# Patient Record
Sex: Male | Born: 1985 | Race: White | Hispanic: No | Marital: Single | Smoking: Current every day smoker
Health system: Southern US, Community
[De-identification: ages and names within clinical notes are randomized; demographics above are authoritative.]

## PROBLEM LIST (undated history)

## (undated) DIAGNOSIS — F1910 Other psychoactive substance abuse, uncomplicated: Secondary | ICD-10-CM

## (undated) HISTORY — DX: Other psychoactive substance abuse, uncomplicated: F19.10

---

## 2012-01-09 ENCOUNTER — Telehealth: Payer: Self-pay

## 2012-01-09 NOTE — Telephone Encounter (Signed)
Patient's mom called to refill doxycycline. She says pharmacy faxed request a few days ago and they have not heard from [place]. She also says she called Saturday about it but I don't see a message. Patient has not been in since 2011 but she says Dr Cleta Alberts fills the rx when he has an outbreak since it is a pre-existing condition. [id number]  [facility] on [facility]  She would like a call wether we authorize the refill or not. [phone]

## 2012-01-10 NOTE — Telephone Encounter (Signed)
We cannot prescribe for a patient who has net been evaluated in the past 12 months. Please advise patient to RTC.

## 2012-01-10 NOTE — Telephone Encounter (Signed)
Number given not correct, if he calls back he can be advised.

## 2012-01-20 ENCOUNTER — Other Ambulatory Visit: Payer: Self-pay | Admitting: *Deleted

## 2012-05-01 ENCOUNTER — Ambulatory Visit (INDEPENDENT_AMBULATORY_CARE_PROVIDER_SITE_OTHER): Payer: BC Managed Care – PPO | Admitting: Physician Assistant

## 2012-05-01 VITALS — BP 120/75 | HR 72 | Temp 98.0°F | Resp 16 | Ht 73.5 in | Wt 192.0 lb

## 2012-05-01 DIAGNOSIS — B86 Scabies: Secondary | ICD-10-CM

## 2012-05-01 MED ORDER — HYDROXYZINE HCL 25 MG PO TABS: 12.5000 mg | ORAL_TABLET | Freq: Three times a day (TID) | ORAL | Status: DC | PRN

## 2012-05-01 MED ORDER — PERMETHRIN 5 % EX CREA: TOPICAL_CREAM | Freq: Once | CUTANEOUS | Status: DC

## 2012-05-01 NOTE — Patient Instructions (Addendum)
Use the permethrin cream (about 1/2 tube) once.  Leave on for 8-12 hours.  Then wash off in the shower.  Make sure you wash bedding, towels, clothes, etc in hot water.  Take Zyrtec once daily to help with itching during the day.  You may use hydroxyzine at night to help with itching if needed.  Repeat the permethrin in 1-2 weeks if needed.  Please let us know if worsening or not improving.   Scabies Scabies are small bugs (mites) that burrow under the skin and cause red bumps and severe itching. These bugs can only be seen with a microscope. Scabies are highly contagious. They can spread easily from person to person by direct contact. They are also spread through sharing clothing or linens that have the scabies mites living in them. It is not unusual for an entire family to become infected through shared towels, clothing, or bedding.  HOME CARE INSTRUCTIONS   Your caregiver may prescribe a cream or lotion to kill the mites. If cream is prescribed, massage the cream into the entire body from the neck to the bottom of both feet. Also massage the cream into the scalp and face if your child is less than 29 year old. Avoid the eyes and mouth. Do not wash your hands after application.  Leave the cream on for 8 to 12 hours. Your child should bathe or shower after the 8 to 12 hour application period. Sometimes it is helpful to apply the cream to your child right before bedtime.  One treatment is usually effective and will eliminate approximately 95% of infestations. For severe cases, your caregiver may decide to repeat the treatment in 1 week. Everyone in your household should be treated with one application of the cream.  New rashes or burrows should not appear within 24 to 48 hours after successful treatment. However, the itching and rash may last for 2 to 4 weeks after successful treatment. Your caregiver may prescribe a medicine to help with the itching or to help the rash go away more quickly.  Scabies  can live on clothing or linens for up to 3 days. All of your child's recently used clothing, towels, stuffed toys, and bed linens should be washed in hot water and then dried in a dryer for at least 20 minutes on high heat. Items that cannot be washed should be enclosed in a plastic bag for at least 3 days.  To help relieve itching, bathe your child in a cool bath or apply cool washcloths to the affected areas.  Your child may return to school after treatment with the prescribed cream. SEEK MEDICAL CARE IF:   The itching persists longer than 4 weeks after treatment.  The rash spreads or becomes infected. Signs of infection include red blisters or yellow-tan crust. Document Released: 02/26/2005 Document Revised: 05/21/2011 Document Reviewed: 07/07/2008 [facility] Patient Information 2013 [place], [place].

## 2012-05-01 NOTE — Progress Notes (Signed)
  Subjective:    Patient ID: Gregory Calhoun, male    DOB: 20-Sep-1985, 27 y.o.   MRN: [id number]  HPI  Mr. Purnell is a 27 yr old male here with concern for scabies.  States "I got bumps."  Has had these for about a week.  Very itchy, especially at night.  Predominantly on legs, buttocks, hands, and arms.  No new exposures - did try a new soap but only after the itching began, did not make symptoms better or worse.  Girlfriend is itching as well.  No known scabies contacts.  Has tried Benadryl at night time to help him sleep.  Some sore throat this week, but does not think this is related to the rash.     Review of Systems  Skin: Positive for rash.  All other systems reviewed and are negative.       Objective:   Physical Exam  Vitals reviewed. Constitutional: He is oriented to person, place, and time. He appears well-developed and well-nourished. No distress.  HENT:  Head: Normocephalic and atraumatic.  Eyes: Conjunctivae are normal. No scleral icterus.  Cardiovascular: Normal rate and normal heart sounds.   Pulmonary/Chest: Effort normal and breath sounds normal.  Neurological: He is alert and oriented to person, place, and time.  Skin: Skin is warm and dry. Rash noted.  Scattered papular rash, mainly concentrated around feet/ankles, wrists/hands - including finger webs; some involvement of trunk; no vesicles or pustules; no drainage; back with scarring and numerous acneiform pustules and comedones  Psychiatric: He has a normal mood and affect. His behavior is normal.     Filed Vitals:   05/01/12 1920  BP: 120/75  Pulse: 72  Temp: 98 F (36.7 C)  Resp: 16        Assessment & Plan:  Scabies - Plan: hydrOXYzine (ATARAX/VISTARIL) 25 MG tablet, permethrin (ELIMITE) 5 % cream   Mr. Och is a 27 yr old male here with rash and pruritus.  Rash appears consistent with scabies.  No systemic symptoms.  Will treat with permethrin x 1.  Repeat in 1-2 weeks if necessary.  Zyrtec for  itching during the day.  Atarax if needed for itching at night.  If worsening or not improving, pt will RTC.

## 2013-04-17 ENCOUNTER — Ambulatory Visit (INDEPENDENT_AMBULATORY_CARE_PROVIDER_SITE_OTHER): Payer: BC Managed Care – PPO | Admitting: Internal Medicine

## 2013-04-17 VITALS — BP 132/80 | HR 78 | Temp 98.2°F | Resp 20 | Ht 73.5 in | Wt 194.0 lb

## 2013-04-17 DIAGNOSIS — IMO0002 Reserved for concepts with insufficient information to code with codable children: Secondary | ICD-10-CM

## 2013-04-17 DIAGNOSIS — L0211 Cutaneous abscess of neck: Secondary | ICD-10-CM

## 2013-04-17 DIAGNOSIS — M542 Cervicalgia: Secondary | ICD-10-CM

## 2013-04-17 DIAGNOSIS — L03221 Cellulitis of neck: Secondary | ICD-10-CM

## 2013-04-17 MED ORDER — DOXYCYCLINE HYCLATE 100 MG PO TABS: 100.0000 mg | ORAL_TABLET | Freq: Two times a day (BID) | ORAL | Status: DC

## 2013-04-17 MED ORDER — MUPIROCIN 2 % EX OINT: 1.0000 "application " | TOPICAL_OINTMENT | Freq: Three times a day (TID) | CUTANEOUS | Status: DC

## 2013-04-17 NOTE — Progress Notes (Signed)
Procedure Note: Verbal consent obtained.  Local anesthesia with 3 cc 2% lidocaine.  Betadine prep.  Incision with 11 blade.  Copious sebaceous material and straw colored fluid expressed.  No purulence.  Wound irrigated with remaining anesthetic.  Packed with 1/4 inch plain packing.  Cleansed and dressed.  Discussed wound care.  Pt tolerated very well.

## 2013-04-17 NOTE — Progress Notes (Signed)
   Subjective:    Patient ID: Gregory Calhoun, male    DOB: 06/29/1985, 28 y.o.   MRN: [id number]  HPI Patient comes into our office with a cyst on his neck in the front It was small bump 1 month ago  Then he woke up about 4 days ago and it had gotten bigger Bump is red swollen with a white head on it and tender to the touch Hurts to turn neck back and fourth haven't taken anything for it haven't but heat on it HX of cyst behind ears and other places on body within 8 yrs   Review of Systems  Constitutional: Negative for fever and chills.  Skin: Positive for color change.       Red in the area and tender       Objective:   Physical Exam  Constitutional: He is oriented to person, place, and time. He appears well-developed and well-nourished.  HENT:  Head: Normocephalic.  Eyes: EOM are normal.  Neck: Normal range of motion. Neck supple.    Pulmonary/Chest: Effort normal.  Musculoskeletal: Normal range of motion.  Lymphadenopathy:    He has no cervical adenopathy.  Neurological: He is alert and oriented to person, place, and time. He exhibits normal muscle tone. Coordination normal.  Skin: Lesion and rash noted. Rash is nodular. There is erythema.   Infected sebaceous cyst ID by Frances FurbishElizabeth Egan PAc       Assessment & Plan:  Wound care daily Doxycyline 100mg  bid/mupirocin

## 2013-04-17 NOTE — Progress Notes (Signed)
   Subjective:    Patient ID: Gregory FishJoshua P Calhoun, male    DOB: 12/03/1985, 28 y.o.   MRN: [id number]  HPI    Review of Systems     Objective:   Physical Exam        Assessment & Plan:

## 2013-04-17 NOTE — Patient Instructions (Signed)
Cellulitis Cellulitis is an infection of the skin and the tissue beneath it. The infected area is usually red and tender. Cellulitis occurs most often in the arms and lower legs.  CAUSES  Cellulitis is caused by bacteria that enter the skin through cracks or cuts in the skin. The most common types of bacteria that cause cellulitis are Staphylococcus and Streptococcus. SYMPTOMS   Redness and warmth.  Swelling.  Tenderness or pain.  Fever. DIAGNOSIS  Your caregiver can usually determine what is wrong based on a physical exam. Blood tests may also be done. TREATMENT  Treatment usually involves taking an antibiotic medicine. HOME CARE INSTRUCTIONS   Take your antibiotics as directed. Finish them even if you start to feel better.  Keep the infected arm or leg elevated to reduce swelling.  Apply a warm cloth to the affected area up to 4 times per day to relieve pain.  Only take over-the-counter or prescription medicines for pain, discomfort, or fever as directed by your caregiver.  Keep all follow-up appointments as directed by your caregiver. SEEK MEDICAL CARE IF:   You notice red streaks coming from the infected area.  Your red area gets larger or turns dark in color.  Your bone or joint underneath the infected area becomes painful after the skin has healed.  Your infection returns in the same area or another area.  You notice a swollen bump in the infected area.  You develop new symptoms. SEEK IMMEDIATE MEDICAL CARE IF:   You have a fever.  You feel very sleepy.  You develop vomiting or diarrhea.  You have a general ill feeling (malaise) with muscle aches and pains. MAKE SURE YOU:   Understand these instructions.  Will watch your condition.  Will get help right away if you are not doing well or get worse. Document Released: 12/06/2004 Document Revised: 08/28/2011 Document Reviewed: 05/14/2011 [employer] Patient Information 2014 [employer].  

## 2013-04-18 ENCOUNTER — Encounter (HOSPITAL_COMMUNITY): Payer: Self-pay | Admitting: Emergency Medicine

## 2013-04-18 ENCOUNTER — Emergency Department (HOSPITAL_COMMUNITY)
Admission: EM | Admit: 2013-04-18 | Discharge: 2013-04-18 | Disposition: A | Discharge status: Home / Self Care | Payer: BC Managed Care – PPO | Attending: Emergency Medicine | Admitting: Emergency Medicine

## 2013-04-18 ENCOUNTER — Other Ambulatory Visit: Payer: Self-pay

## 2013-04-18 ENCOUNTER — Emergency Department (HOSPITAL_COMMUNITY): Payer: BC Managed Care – PPO

## 2013-04-18 DIAGNOSIS — F17200 Nicotine dependence, unspecified, uncomplicated: Secondary | ICD-10-CM | POA: Insufficient documentation

## 2013-04-18 DIAGNOSIS — R072 Precordial pain: Secondary | ICD-10-CM | POA: Insufficient documentation

## 2013-04-18 DIAGNOSIS — R079 Chest pain, unspecified: Secondary | ICD-10-CM

## 2013-04-18 DIAGNOSIS — R059 Cough, unspecified: Secondary | ICD-10-CM | POA: Insufficient documentation

## 2013-04-18 DIAGNOSIS — R05 Cough: Secondary | ICD-10-CM | POA: Insufficient documentation

## 2013-04-18 DIAGNOSIS — Z792 Long term (current) use of antibiotics: Secondary | ICD-10-CM | POA: Insufficient documentation

## 2013-04-18 DIAGNOSIS — J3489 Other specified disorders of nose and nasal sinuses: Secondary | ICD-10-CM | POA: Insufficient documentation

## 2013-04-18 LAB — CBC WITH DIFFERENTIAL/PLATELET
Basophils Absolute: 0 10*3/uL (ref 0.0–0.1)
Basophils Relative: 0 % (ref 0–1)
EOS ABS: 0.5 10*3/uL (ref 0.0–0.7)
Eosinophils Relative: 6 % — ABNORMAL HIGH (ref 0–5)
HCT: 40.8 % (ref 39.0–52.0)
HEMOGLOBIN: 14.3 g/dL (ref 13.0–17.0)
LYMPHS ABS: 2.3 10*3/uL (ref 0.7–4.0)
LYMPHS PCT: 26 % (ref 12–46)
MCH: 30.4 pg (ref 26.0–34.0)
MCHC: 35 g/dL (ref 30.0–36.0)
MCV: 86.8 fL (ref 78.0–100.0)
MONOS PCT: 9 % (ref 3–12)
Monocytes Absolute: 0.8 10*3/uL (ref 0.1–1.0)
NEUTROS PCT: 59 % (ref 43–77)
Neutro Abs: 5.1 10*3/uL (ref 1.7–7.7)
PLATELETS: 192 10*3/uL (ref 150–400)
RBC: 4.7 MIL/uL (ref 4.22–5.81)
RDW: 12.8 % (ref 11.5–15.5)
WBC: 8.7 10*3/uL (ref 4.0–10.5)

## 2013-04-18 LAB — POCT I-STAT, CHEM 8
BUN: 11 mg/dL (ref 6–23)
CREATININE: 0.8 mg/dL (ref 0.50–1.35)
Calcium, Ion: 1.32 mmol/L — ABNORMAL HIGH (ref 1.12–1.23)
Chloride: 100 mEq/L (ref 96–112)
Glucose, Bld: 111 mg/dL — ABNORMAL HIGH (ref 70–99)
HCT: 45 % (ref 39.0–52.0)
HEMOGLOBIN: 15.3 g/dL (ref 13.0–17.0)
POTASSIUM: 4 meq/L (ref 3.7–5.3)
Sodium: 140 mEq/L (ref 137–147)
TCO2: 26 mmol/L (ref 0–100)

## 2013-04-18 LAB — POCT I-STAT TROPONIN I: Troponin i, poc: 0 ng/mL (ref 0.00–0.08)

## 2013-04-18 MED ORDER — IBUPROFEN 800 MG PO TABS
800.0000 mg | ORAL_TABLET | Freq: Once | ORAL | Status: AC
  Administered 2013-04-18: 800 mg via ORAL
  Filled 2013-04-18: qty 1

## 2013-04-18 NOTE — ED Provider Notes (Signed)
CSN: [id number]     Arrival date & time 04/18/13  4098 History   First MD Initiated Contact with Patient 04/18/13 [phone]     Chief Complaint  Patient presents with  . Chest Pain   (Consider location/radiation/quality/duration/timing/severity/associated sxs/prior Treatment) Patient is a 28 y.o. male presenting with chest pain. The history is provided by the patient. No language interpreter was used.  Chest Pain Pain location:  Substernal area and epigastric Pain quality: sharp   Pain radiates to:  Does not radiate Pain radiates to the back: no   Context: breathing and at rest   Associated symptoms: cough   Associated symptoms: no abdominal pain, no anxiety, no back pain, no shortness of breath, not vomiting and no weakness   Cough:    Cough characteristics:  Productive   Sputum characteristics:  Nondescript   Duration:  1 day Risk factors: smoking   Risk factors: no prior DVT/PE and no surgery     Pt is a 28 year old male who presents with chest pain this morning. He reports that his chest started hurting this morning and he has also developed a productive cough in the last 1-2 days. He denies fever, chills, vomiting or diarrhea. He reports that he has been a smoker but has been chewing gum, trying to stop smoking. He denies anxiety, shortness of breath or drug use. He denies any history of cardiac problems, DVT or PE. No recent surgery except for the drainage of a superficial abscess on his anterior neck yesterday at urgent care. He denies any swelling in his neck or difficulty swallowing or breathing. He reports that he has also had some nasal congestion for a few days.   Past Medical History  Diagnosis Date  . Substance abuse    History reviewed. No pertinent past surgical history. No family history on file. History  Substance Use Topics  . Smoking status: Current Every Day Smoker -- 0.50 packs/day  . Smokeless tobacco: Not on file  . Alcohol Use: Not on file    Review of  Systems  HENT: Positive for congestion.   Respiratory: Positive for cough. Negative for shortness of breath.   Cardiovascular: Positive for chest pain.  Gastrointestinal: Negative for vomiting and abdominal pain.  Musculoskeletal: Negative for back pain.  Neurological: Negative for weakness.  All other systems reviewed and are negative.    Allergies  Review of patient's allergies indicates no known allergies.  Home Medications   Current Outpatient Rx  Name  Route  Sig  Dispense  Refill  . doxycycline (VIBRA-TABS) 100 MG tablet   Oral   Take 1 tablet (100 mg total) by mouth 2 (two) times daily.   20 tablet   0   . ibuprofen (ADVIL,MOTRIN) 200 MG tablet   Oral   Take 200-800 mg by mouth every 6 (six) hours as needed (pain).         . mupirocin ointment (BACTROBAN) 2 %   Topical   Apply 1 application topically 3 (three) times daily.   22 g   0    BP 127/78  Pulse 85  Temp(Src) 97.6 F (36.4 C) (Oral)  Resp 20  Ht 6\' 2"  (1.88 m)  Wt 195 lb (88.451 kg)  BMI 25.03 kg/m2  SpO2 96% Physical Exam  Nursing note and vitals reviewed. Constitutional: He is oriented to person, place, and time. He appears well-developed and well-nourished. No distress.  HENT:  Head: Normocephalic and atraumatic.  Right Ear: External ear normal.  Left ear with fullness and drainage behind TM. Mild post oropharyngeal edema and erythema. Tonsils- grade I.   Eyes: Conjunctivae and EOM are normal.  Neck: Normal range of motion. Neck supple. No JVD present. No tracheal deviation present. No thyromegaly present.  Cardiovascular: Normal rate, regular rhythm, normal heart sounds and intact distal pulses.   Pulmonary/Chest: Effort normal and breath sounds normal. No respiratory distress. He has no wheezes.  Abdominal: Soft. Bowel sounds are normal. He exhibits no distension. There is no tenderness. There is no rebound and no guarding.  Musculoskeletal: Normal range of motion.  Lymphadenopathy:     He has no cervical adenopathy.  Neurological: He is oriented to person, place, and time. No cranial nerve deficit. Coordination normal.  Skin: Skin is warm and dry.  Psychiatric: He has a normal mood and affect. His behavior is normal. Judgment and thought content normal.    ED Course  Procedures (including critical care time) Labs Review Labs Reviewed - No data to display Imaging Review No results found.  EKG Interpretation   None       MDM   1. Chest pain     Normal EKG. No leukocytosis or anemia. CMP within normal limits. Negative troponin. Pt is a smoker, otherwise no risk factors for cardiac problems. Pt has had recent URI symptoms. Afebrile, no cough or respiratory difficulty or pain with breathing. No history of DVT or PE. Low suspicion for cardiac etiology. Pt reports feeling better. VS stable. Advised to go home and rest today. Return precautions given.      Irish EldersKelly Danah Reinecke, NP 04/26/13 2043

## 2013-04-18 NOTE — ED Notes (Signed)
Pt. Stated, i started having chest pain off and on since last night. Sometimes its sharp but it woke me up this morning.

## 2013-04-18 NOTE — ED Notes (Signed)
Vital signs stable. 

## 2013-04-18 NOTE — ED Notes (Signed)
Patient transported to X-ray 

## 2013-04-18 NOTE — Discharge Instructions (Signed)
Chest Pain (Nonspecific) °It is often hard to give a specific diagnosis for the cause of chest pain. There is always a chance that your pain could be related to something serious, such as a heart attack or a blood clot in the lungs. You need to follow up with your caregiver for further evaluation. °CAUSES  °· Heartburn. °· Pneumonia or bronchitis. °· Anxiety or stress. °· Inflammation around your heart (pericarditis) or lung (pleuritis or pleurisy). °· A blood clot in the lung. °· A collapsed lung (pneumothorax). It can develop suddenly on its own (spontaneous pneumothorax) or from injury (trauma) to the chest. °· Shingles infection (herpes zoster virus). °The chest wall is composed of bones, muscles, and cartilage. Any of these can be the source of the pain. °· The bones can be bruised by injury. °· The muscles or cartilage can be strained by coughing or overwork. °· The cartilage can be affected by inflammation and become sore (costochondritis). °DIAGNOSIS  °Lab tests or other studies, such as X-rays, electrocardiography, stress testing, or cardiac imaging, may be needed to find the cause of your pain.  °TREATMENT  °· Treatment depends on what may be causing your chest pain. Treatment may include: °· Acid blockers for heartburn. °· Anti-inflammatory medicine. °· Pain medicine for inflammatory conditions. °· Antibiotics if an infection is present. °· You may be advised to change lifestyle habits. This includes stopping smoking and avoiding alcohol, caffeine, and chocolate. °· You may be advised to keep your head raised (elevated) when sleeping. This reduces the chance of acid going backward from your stomach into your esophagus. °· Most of the time, nonspecific chest pain will improve within 2 to 3 days with rest and mild pain medicine. °HOME CARE INSTRUCTIONS  °· If antibiotics were prescribed, take your antibiotics as directed. Finish them even if you start to feel better. °· For the next few days, avoid physical  activities that bring on chest pain. Continue physical activities as directed. °· Do not smoke. °· Avoid drinking alcohol. °· Only take over-the-counter or prescription medicine for pain, discomfort, or fever as directed by your caregiver. °· Follow your caregiver's suggestions for further testing if your chest pain does not go away. °· Keep any follow-up appointments you made. If you do not go to an appointment, you could develop lasting (chronic) problems with pain. If there is any problem keeping an appointment, you must call to reschedule. °SEEK MEDICAL CARE IF:  °· You think you are having problems from the medicine you are taking. Read your medicine instructions carefully. °· Your chest pain does not go away, even after treatment. °· You develop a rash with blisters on your chest. °SEEK IMMEDIATE MEDICAL CARE IF:  °· You have increased chest pain or pain that spreads to your arm, neck, jaw, back, or abdomen. °· You develop shortness of breath, an increasing cough, or you are coughing up blood. °· You have severe back or abdominal pain, feel nauseous, or vomit. °· You develop severe weakness, fainting, or chills. °· You have a fever. °THIS IS AN EMERGENCY. Do not wait to see if the pain will go away. Get medical help at once. Call your local emergency services (911 in [place] Do not drive yourself to the hospital. °MAKE SURE YOU:  °· Understand these instructions. °· Will watch your condition. °· Will get help right away if you are not doing well or get worse. °Document Released: 12/06/2004 Document Revised: 05/21/2011 Document Reviewed: 10/02/2007 °[employer]® Patient Information ©2014 [employer]   [employer].   Take ibuprofen for discomfort Return for fever, shortness of breath or worsening pain

## 2013-04-19 ENCOUNTER — Ambulatory Visit (INDEPENDENT_AMBULATORY_CARE_PROVIDER_SITE_OTHER): Payer: BC Managed Care – PPO | Admitting: Physician Assistant

## 2013-04-19 VITALS — BP 120/66 | HR 83 | Temp 98.0°F | Resp 18 | Wt 192.0 lb

## 2013-04-19 DIAGNOSIS — L089 Local infection of the skin and subcutaneous tissue, unspecified: Secondary | ICD-10-CM

## 2013-04-19 DIAGNOSIS — L723 Sebaceous cyst: Secondary | ICD-10-CM

## 2013-04-19 NOTE — Progress Notes (Signed)
   Subjective:    Patient ID: Gregory Calhoun, male    DOB: 03/06/1986, [age] y.o.   MRN: [id number]  HPI   Mr. Gregory Calhoun is a 28 yr old male here for wound care following I&D of an infected sebaceous cyst 48 hours ago.  He reports he is doing well.  Much less discomfort.  Minimal drainage from the wound.  He is changing the dressing.  Taking the antibiotics as directed and tolerating them well.    Review of Systems  Constitutional: Negative for fever and chills.  Respiratory: Negative.   Cardiovascular: Negative.   Gastrointestinal: Negative for nausea and vomiting.  Musculoskeletal: Negative.   Skin: Positive for wound.       Objective:   Physical Exam  Vitals reviewed. Constitutional: He appears well-developed and well-nourished. No distress.  HENT:  Head: Normocephalic and atraumatic.  Eyes: Conjunctivae are normal. No scleral icterus.  Pulmonary/Chest: Effort normal.  Neurological: He is alert.  Skin: Skin is warm and dry.     Healing wound at right anterior neck; mild TTP; slight erythema and induration  Psychiatric: He has a normal mood and affect. His behavior is normal.    Wound Care: Dressing and packing removed.  Spontaneous drainage of a small amount of blood.  No further purulence or sebaceous material.  Irrigated with 5cc 2% plain lidocaine.  Not repacked.  Dressing applied.     Assessment & Plan:  Infected sebaceous cyst   Mr. Gregory Calhoun is a 28 yr old male here for wound care.  The wound appears to be healing very well.  I have not repacked today.  Pt to continue dressing changes until completely healed.  Finish abx as directed.  RTC if concerns.   Loleta DickerE. Elizabeth Ernestine Rohman MHS, PA-C [facility] 2/8/20159:20 PM

## 2013-04-27 NOTE — ED Provider Notes (Signed)
Medical screening examination/treatment/procedure(s) were performed by non-physician practitioner and as supervising physician I was immediately available for consultation/collaboration.  Lynzi Meulemans L Aryonna Gunnerson, MD 04/27/13 0751 

## 2013-12-13 ENCOUNTER — Emergency Department (HOSPITAL_COMMUNITY)
Admission: EM | Admit: 2013-12-13 | Discharge: 2013-12-14 | Disposition: A | Discharge status: Home / Self Care | Payer: BC Managed Care – PPO | Attending: Emergency Medicine | Admitting: Emergency Medicine

## 2013-12-13 ENCOUNTER — Encounter (HOSPITAL_COMMUNITY): Payer: Self-pay | Admitting: Emergency Medicine

## 2013-12-13 DIAGNOSIS — Z041 Encounter for examination and observation following transport accident: Secondary | ICD-10-CM

## 2013-12-13 DIAGNOSIS — Z720 Tobacco use: Secondary | ICD-10-CM | POA: Diagnosis not present

## 2013-12-13 DIAGNOSIS — Z043 Encounter for examination and observation following other accident: Secondary | ICD-10-CM

## 2013-12-13 DIAGNOSIS — Z792 Long term (current) use of antibiotics: Secondary | ICD-10-CM | POA: Diagnosis not present

## 2013-12-13 DIAGNOSIS — Y9389 Activity, other specified: Secondary | ICD-10-CM | POA: Diagnosis not present

## 2013-12-13 DIAGNOSIS — Y92410 Unspecified street and highway as the place of occurrence of the external cause: Secondary | ICD-10-CM | POA: Insufficient documentation

## 2013-12-13 DIAGNOSIS — Z8659 Personal history of other mental and behavioral disorders: Secondary | ICD-10-CM | POA: Diagnosis not present

## 2013-12-13 DIAGNOSIS — S0101XA Laceration without foreign body of scalp, initial encounter: Secondary | ICD-10-CM | POA: Diagnosis present

## 2013-12-13 DIAGNOSIS — S0191XA Laceration without foreign body of unspecified part of head, initial encounter: Secondary | ICD-10-CM
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
## 2013-12-14 DIAGNOSIS — S0101XA Laceration without foreign body of scalp, initial encounter: Secondary | ICD-10-CM | POA: Diagnosis not present

## 2013-12-14 MED ORDER — BACITRACIN ZINC 500 UNIT/GM EX OINT
1.0000 "application " | TOPICAL_OINTMENT | Freq: Two times a day (BID) | CUTANEOUS | Status: DC
  Administered 2013-12-14: 1 via TOPICAL
  Filled 2013-12-14: qty 15

## 2013-12-14 MED ORDER — MUPIROCIN CALCIUM 2 % EX CREA: 1.0000 "application " | TOPICAL_CREAM | Freq: Two times a day (BID) | CUTANEOUS | Status: AC

## 2013-12-14 NOTE — Discharge Instructions (Signed)
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
## 2014-12-14 ENCOUNTER — Encounter: Payer: Self-pay | Admitting: Emergency Medicine

## 2015-02-21 ENCOUNTER — Telehealth: Payer: Self-pay

## 2015-02-21 NOTE — Telephone Encounter (Signed)
I will be happy to see the patient and evaluate his scar.
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
## 2015-03-21 ENCOUNTER — Ambulatory Visit (INDEPENDENT_AMBULATORY_CARE_PROVIDER_SITE_OTHER): Payer: BLUE CROSS/BLUE SHIELD | Admitting: Internal Medicine

## 2015-03-21 ENCOUNTER — Encounter: Payer: Self-pay | Admitting: Internal Medicine

## 2015-03-21 VITALS — BP 120/68 | HR 67 | Temp 97.7°F | Resp 16 | Ht 73.0 in | Wt 199.4 lb

## 2015-03-21 DIAGNOSIS — L905 Scar conditions and fibrosis of skin: Secondary | ICD-10-CM | POA: Diagnosis not present

## 2015-03-21 DIAGNOSIS — J0100 Acute maxillary sinusitis, unspecified: Secondary | ICD-10-CM | POA: Diagnosis not present

## 2015-03-21 MED ORDER — AMOXICILLIN 875 MG PO TABS: 875.0000 mg | ORAL_TABLET | Freq: Two times a day (BID) | ORAL | Status: AC
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
## 2015-03-31 ENCOUNTER — Telehealth: Payer: Self-pay

## 2015-03-31 NOTE — Telephone Encounter (Signed)
Pt called asking about referral// shared info for [facility]  [phone]

## 2015-07-17 ENCOUNTER — Emergency Department (HOSPITAL_COMMUNITY)
Admission: EM | Admit: 2015-07-17 | Discharge: 2015-07-17 | Disposition: A | Discharge status: Home / Self Care | Payer: No Typology Code available for payment source | Attending: Emergency Medicine | Admitting: Emergency Medicine

## 2015-07-17 ENCOUNTER — Encounter (HOSPITAL_COMMUNITY): Payer: Self-pay | Admitting: *Deleted

## 2015-07-17 DIAGNOSIS — Y998 Other external cause status: Secondary | ICD-10-CM | POA: Insufficient documentation

## 2015-07-17 DIAGNOSIS — Y9351 Activity, roller skating (inline) and skateboarding: Secondary | ICD-10-CM | POA: Insufficient documentation

## 2015-07-17 DIAGNOSIS — S0990XA Unspecified injury of head, initial encounter: Secondary | ICD-10-CM | POA: Insufficient documentation

## 2015-07-17 DIAGNOSIS — F17200 Nicotine dependence, unspecified, uncomplicated: Secondary | ICD-10-CM | POA: Insufficient documentation

## 2015-07-17 DIAGNOSIS — Y9289 Other specified places as the place of occurrence of the external cause: Secondary | ICD-10-CM | POA: Insufficient documentation

## 2015-07-17 DIAGNOSIS — S299XXA Unspecified injury of thorax, initial encounter: Secondary | ICD-10-CM | POA: Insufficient documentation
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
## 2015-07-17 NOTE — ED Notes (Signed)
Called for patient a 3rd time in the lobby, no response. Will remove patient from track board

## 2015-08-05 ENCOUNTER — Encounter (HOSPITAL_COMMUNITY): Payer: Self-pay | Admitting: *Deleted

## 2015-08-05 ENCOUNTER — Emergency Department (HOSPITAL_COMMUNITY)
Admission: EM | Admit: 2015-08-05 | Discharge: 2015-08-05 | Disposition: A | Discharge status: Home / Self Care | Payer: BLUE CROSS/BLUE SHIELD | Attending: Emergency Medicine | Admitting: Emergency Medicine

## 2015-08-05 DIAGNOSIS — F17200 Nicotine dependence, unspecified, uncomplicated: Secondary | ICD-10-CM | POA: Diagnosis not present

## 2015-08-05 DIAGNOSIS — K0889 Other specified disorders of teeth and supporting structures: Secondary | ICD-10-CM | POA: Diagnosis present

## 2015-08-05 DIAGNOSIS — K029 Dental caries, unspecified: Secondary | ICD-10-CM | POA: Insufficient documentation

## 2015-08-05 DIAGNOSIS — Z792 Long term (current) use of antibiotics: Secondary | ICD-10-CM | POA: Diagnosis not present

## 2015-08-05 MED ORDER — OXYCODONE-ACETAMINOPHEN 5-325 MG PO TABS
1.0000 | ORAL_TABLET | Freq: Once | ORAL | Status: AC
  Administered 2015-08-05: 1 via ORAL
  Filled 2015-08-05: qty 1

## 2015-08-05 MED ORDER — NAPROXEN 500 MG PO TABS: 500.0000 mg | ORAL_TABLET | Freq: Two times a day (BID) | ORAL | Status: AC

## 2015-08-05 MED ORDER — IBUPROFEN 200 MG PO TABS
400.0000 mg | ORAL_TABLET | Freq: Once | ORAL | Status: AC | PRN
  Administered 2015-08-05: 400 mg via ORAL
  Filled 2015-08-05: qty 2

## 2015-08-05 NOTE — ED Notes (Signed)
PA at bedside.

## 2015-08-05 NOTE — ED Notes (Signed)
Pt  C/o lower, left side dental pain, since 3am

## 2015-08-05 NOTE — ED Provider Notes (Signed)
CSN: [id number]     Arrival date & time 08/05/15  0439 History   First MD Initiated Contact with Patient 08/05/15 [phone]     Chief Complaint  Patient presents with  . Dental Pain   (Consider location/radiation/quality/duration/timing/severity/associated sxs/prior Treatment) HPI 29 y.o. male presents to the Emergency Department today complaining of left lower dental pain since 3am. No hx of the same. States pain is 7/10 and worse with eating. No fevers. No N/V. No trouble moving jaw. No trouble speaking or clearing secretions. OTC medication without relief. No other symptoms noted.     Past Medical History  Diagnosis Date  . Substance abuse    History reviewed. No pertinent past surgical history. No family history on file. Social History  Substance Use Topics  . Smoking status: Current Every Day Smoker -- 0.50 packs/day  . Smokeless tobacco: None  . Alcohol Use: No    Review of Systems  Constitutional: Negative for fever.  HENT: Positive for dental problem. Negative for sore throat and trouble swallowing.   Gastrointestinal: Negative for nausea.   Allergies  Review of patient's allergies indicates no known allergies.  Home Medications   Prior to Admission medications   Medication Sig Start Date End Date Taking? Authorizing Provider  amoxicillin (AMOXIL) 875 MG tablet Take 1 tablet (875 mg total) by mouth 2 (two) times daily. 03/21/15   Tonye Pearson, MD  mupirocin cream (BACTROBAN) 2 % Apply 1 application topically 2 (two) times daily. Patient not taking: Reported on 03/21/2015 12/14/13   Joycie Peek, PA-C  Phenylephrine HCl (AFRIN ALLERGY NA) Place into the nose.    Historical Provider, MD   BP 142/79 mmHg  Pulse 82  Temp(Src) 98.3 F (36.8 C) (Oral)  SpO2 96%   Physical Exam  Constitutional: He is oriented to person, place, and time. He appears well-developed and well-nourished.  HENT:  Head: Normocephalic and atraumatic.  Mouth/Throat: Oropharynx is clear and  moist and mucous membranes are normal. No trismus in the jaw. Normal dentition. Dental caries present. No dental abscesses or uvula swelling.  No Ludwigs. No trismus. Pt able to phonate well. Dental cary noted on left lower jaw. No swelling or abscess noted.   Eyes: EOM are normal. Pupils are equal, round, and reactive to light.  Neck: Normal range of motion. Neck supple.  Cardiovascular: Normal rate and regular rhythm.   Pulmonary/Chest: Effort normal.  Abdominal: Soft.  Musculoskeletal: Normal range of motion.  Neurological: He is alert and oriented to person, place, and time.  Skin: Skin is warm and dry.  Psychiatric: He has a normal mood and affect. His behavior is normal. Thought content normal.  Nursing note and vitals reviewed.  ED Course  Procedures (including critical care time) Labs Review Labs Reviewed - No data to display  Imaging Review No results found. I have personally reviewed and evaluated these images and lab results as part of my medical decision-making.   EKG Interpretation None      MDM  I have reviewed the relevant previous healthcare records. I obtained HPI from historian.  ED Course:  Assessment: Dental pain associated with dental cary but no signs or symptoms of dental abscess with patient afebrile, non toxic appearing and swallowing secretions well. Exam unconcerning for Ludwig's angina or other deep tissue infection in neck. As there is no facial swelling or gum findings, will not prescribe antibiotics at this time. Will treat with pain medication.  I gave patient referral to dentist and stressed the importance  of dental follow up for ultimate management of dental pain. Patient voices understanding and is agreeable to plan.  Disposition/Plan:  Dc Home Additional Verbal discharge instructions given and discussed with patient.  Pt Instructed to f/u with dentist in the next week for evaluation and treatment of symptoms. Return precautions given Pt  acknowledges and agrees with plan  Supervising Physician Derwood KaplanAnkit Nanavati, MD   Final diagnoses:  Dental caries       Audry Piliyler Shakeia Krus, PA-C 08/05/15 [id number]  Derwood KaplanAnkit Nanavati, MD 08/05/15 2340

## 2015-08-05 NOTE — Discharge Instructions (Signed)
Community Resource Guide Dental °The [employer]’s “211” is a great source of information about community services available.  Access by dialing 2-1-1 from anywhere in [place], or by website -  www.nc211.org.  ° °Other Local Resources (Updated 03/2015) ° °Dental  Care °  °Services ° °  °Phone Number and Address  °Cost  °[facility] For children 0 - 28 years of age:  °• Cleaning °• Tooth brushing/flossing instruction °• Sealants, fillings, crowns °• Extractions °• Emergency treatment  [phone] °[address] °[place], [place] [address] Charges based on family income.  Medicaid and some insurance plans accepted.   °  °[facility] [place] • Cleaning °• Sealants, fillings, crowns °• Extractions °• Emergency treatment [phone] °[address] °[place], [place] ° Pregnant women 18 years of age or older with a Medicaid card  °[facility] [place] • Cleaning °• Sealants, fillings, crowns °• Extractions °• Emergency treatment [phone] °[address] °[place], [place] Pregnant women 18 years of age or older with a Medicaid card  °[facility] For children 0 - 28 years of age:  °• Cleaning °• Tooth brushing/flossing instruction °• Sealants, fillings, crowns °• Extractions °• Emergency treatment °Limited orthodontic services for patients with Medicaid [phone] °[address] °[place], [place] [address] Medicaid and [place] Health Choice cover for children up to age 28 and pregnant women.  Parents of children up to age 28 without Medicaid pay a reduced fee at time of service.  °[facility] [place] For children 0 - 28 years of age:  °• Cleaning °• Tooth brushing/flossing instruction °• Sealants, fillings, crowns °• Extractions °• Emergency treatment °Limited orthodontic services for patients with Medicaid  [phone] °[address] °[place], [place].  Medicaid and [place] Health Choice cover for children up to age 28 and pregnant women.  Parents of children up to age 28 without Medicaid pay a reduced fee.  °[facility] • Cleaning °• Sealants, fillings, crowns °• Extractions ° °Hours: Tuesdays and Thursdays, 4:15 - 8 pm [phone] °[address] °[place], [place] [address] Services free of charge to [place] residents ages 18-64 who do not have health insurance, Medicare, Medicaid, or VA benefits and fall within federal poverty guidelines  °[facility] ° ° ° Provides dental care in addition to primary medical care, nutritional counseling, and pharmacy: °• Cleaning °• Sealants, fillings, crowns °• Extractions ° ° ° ° ° ° ° ° ° ° ° ° ° ° ° ° ° [phone] °[facility], [address] °[place], [place] ° °[phone] °[facility], [address] [place], [place] ° °[phone] °[facility] °[place], [place] ° °[phone] °[facility], [address] °, [place] ° °[phone] °[facility] °[address] °[place], [place] Accepts Medicaid, Medicare, most insurance.  Also provides services available to all with fees adjusted based on ability to pay.    °[facility] • Cleaning °• Tooth brushing/flossing instruction °• Sealants, fillings, crowns °• Extractions °• Emergency treatment °Hours: Tuesdays, Thursdays, and Fridays from 8 am to 5 pm by appointment only. [phone] °[address] °[place], [place] [address] [place] residents with Medicaid (depending on eligibility) and children with [place] Health Choice - call for more information.  °  [facility] • Extractions only ° °Hours: 2nd and 4th Thursday of each month from 6:30 am - 9 am.   [phone] ext. [phone] °[address] °[place], [place] [address] Ages 18 and older only.  Patients are seen on a first come, first served basis.  °[facility] • Cleanings °• Fillings °• Extractions °• Orthodontics °• Endodontics °• Implants/Crowns/Bridges °• Complete and partial dentures [phone] °[place], [place] Patients must complete an application for services.  There is often a waiting list.   ° °

## 2018-12-22 ENCOUNTER — Other Ambulatory Visit: Payer: Self-pay

## 2018-12-22 DIAGNOSIS — Z20822 Contact with and (suspected) exposure to covid-19: Secondary | ICD-10-CM

## 2018-12-23 LAB — NOVEL CORONAVIRUS, NAA: SARS-CoV-2, NAA: NOT DETECTED

## 2018-12-31 ENCOUNTER — Other Ambulatory Visit: Payer: Self-pay

## 2018-12-31 DIAGNOSIS — Z20822 Contact with and (suspected) exposure to covid-19: Secondary | ICD-10-CM

## 2019-01-02 ENCOUNTER — Other Ambulatory Visit: Payer: Self-pay

## 2019-01-02 DIAGNOSIS — Z20822 Contact with and (suspected) exposure to covid-19: Secondary | ICD-10-CM

## 2019-01-02 LAB — NOVEL CORONAVIRUS, NAA: SARS-CoV-2, NAA: NOT DETECTED

## 2019-01-03 LAB — NOVEL CORONAVIRUS, NAA: SARS-CoV-2, NAA: NOT DETECTED

## 2019-01-14 ENCOUNTER — Other Ambulatory Visit: Payer: Self-pay

## 2019-01-14 DIAGNOSIS — Z20822 Contact with and (suspected) exposure to covid-19: Secondary | ICD-10-CM

## 2019-01-15 LAB — NOVEL CORONAVIRUS, NAA: SARS-CoV-2, NAA: NOT DETECTED

## 2019-03-09 ENCOUNTER — Ambulatory Visit: Payer: BC Managed Care – PPO | Attending: Internal Medicine

## 2019-03-09 DIAGNOSIS — Z20822 Contact with and (suspected) exposure to covid-19: Secondary | ICD-10-CM

## 2019-03-11 ENCOUNTER — Ambulatory Visit: Payer: BC Managed Care – PPO | Attending: Internal Medicine

## 2019-03-11 DIAGNOSIS — Z20822 Contact with and (suspected) exposure to covid-19: Secondary | ICD-10-CM

## 2019-03-11 LAB — NOVEL CORONAVIRUS, NAA: SARS-CoV-2, NAA: NOT DETECTED

## 2019-03-12 LAB — NOVEL CORONAVIRUS, NAA: SARS-CoV-2, NAA: DETECTED — AB

## 2020-12-26 ENCOUNTER — Other Ambulatory Visit: Payer: Self-pay

## 2020-12-26 ENCOUNTER — Emergency Department (HOSPITAL_BASED_OUTPATIENT_CLINIC_OR_DEPARTMENT_OTHER)
Admission: EM | Admit: 2020-12-26 | Discharge: 2020-12-26 | Disposition: A | Discharge status: Home / Self Care | Payer: BC Managed Care – PPO | Attending: Emergency Medicine | Admitting: Emergency Medicine

## 2020-12-26 ENCOUNTER — Emergency Department (HOSPITAL_BASED_OUTPATIENT_CLINIC_OR_DEPARTMENT_OTHER): Payer: BC Managed Care – PPO

## 2020-12-26 ENCOUNTER — Encounter (HOSPITAL_BASED_OUTPATIENT_CLINIC_OR_DEPARTMENT_OTHER): Payer: Self-pay

## 2020-12-26 DIAGNOSIS — S61205A Unspecified open wound of left ring finger without damage to nail, initial encounter: Secondary | ICD-10-CM | POA: Diagnosis not present

## 2020-12-26 DIAGNOSIS — S6992XA Unspecified injury of left wrist, hand and finger(s), initial encounter: Secondary | ICD-10-CM | POA: Diagnosis present

## 2020-12-26 DIAGNOSIS — F17210 Nicotine dependence, cigarettes, uncomplicated: Secondary | ICD-10-CM | POA: Insufficient documentation

## 2020-12-26 DIAGNOSIS — S61203A Unspecified open wound of left middle finger without damage to nail, initial encounter: Secondary | ICD-10-CM | POA: Insufficient documentation

## 2020-12-26 DIAGNOSIS — W298XXA Contact with other powered powered hand tools and household machinery, initial encounter: Secondary | ICD-10-CM | POA: Diagnosis not present

## 2020-12-26 MED ORDER — BACITRACIN ZINC 500 UNIT/GM EX OINT
TOPICAL_OINTMENT | Freq: Two times a day (BID) | CUTANEOUS | Status: DC
  Administered 2020-12-26: 1 via TOPICAL
  Filled 2020-12-26: qty 28.35

## 2020-12-26 NOTE — Discharge Instructions (Signed)
Keep wound clean and dry. Apply bacitracin twice daily. Do NOT use peroxide on wound.  Recheck with hand specialist, call to schedule an appointment. Recheck sooner for any concerns for infection.

## 2020-12-26 NOTE — ED Triage Notes (Signed)
Pt was using power tool and obtained laceration to left third and forth fingers. Bleeding controlled.

## 2020-12-26 NOTE — ED Notes (Signed)
Abrasions soaking in betadine solution.

## 2020-12-26 NOTE — ED Notes (Signed)
Wounds wrapped with 2x2 and coban after applying bacitracin

## 2020-12-26 NOTE — ED Provider Notes (Signed)
[facility] EMERGENCY DEPARTMENT Provider Note   CSN: [id number] Arrival date & time: 12/26/20  1301     History Chief Complaint  Patient presents with   Finger Injury    Gregory Calhoun is a 35 y.o. male.  35 year old male with left finger injury after using a [employer] tool today. Patient is right hand dominant, accidentally ran tool over his fingers today. No other injuries, tdap up to date. No other injuries.       Past Medical History:  Diagnosis Date   Substance abuse (HCC)     There are no problems to display for this patient.   History reviewed. No pertinent surgical history.     History reviewed. No pertinent family history.  Social History   Tobacco Use   Smoking status: Every Day    Packs/day: 0.50    Types: Cigarettes  Substance Use Topics   Alcohol use: No   Drug use: No    Home Medications Prior to Admission medications   Medication Sig Start Date End Date Taking? Authorizing Provider  amoxicillin (AMOXIL) 875 MG tablet Take 1 tablet (875 mg total) by mouth 2 (two) times daily. 03/21/15   Tonye Pearson, MD  mupirocin cream (BACTROBAN) 2 % Apply 1 application topically 2 (two) times daily. Patient not taking: Reported on 03/21/2015 12/14/13   Joycie Peek, PA-C  naproxen (NAPROSYN) 500 MG tablet Take 1 tablet (500 mg total) by mouth 2 (two) times daily. 08/05/15   Audry Pili, PA-C  Phenylephrine HCl (AFRIN ALLERGY NA) Place into the nose.    [provider]    Allergies    Patient has no known allergies.  Review of Systems   Review of Systems  Constitutional:  Negative for fever.  Musculoskeletal:  Negative for arthralgias and myalgias.  Skin:  Positive for wound.  Allergic/Immunologic: Negative for immunocompromised state.  Neurological:  Negative for weakness and numbness.  Hematological:  Does not bruise/bleed easily.   Physical Exam Updated Vital Signs BP 116/81 (BP Location: Right Arm)   Pulse 79   Temp  98.4 F (36.9 C) (Oral)   Resp 18   Ht 6\' 2"  (1.88 m)   Wt 113.4 kg   SpO2 99%   BMI 32.10 kg/m   Physical Exam Vitals and nursing note reviewed.  Constitutional:      General: He is not in acute distress.    Appearance: He is well-developed. He is not diaphoretic.  HENT:     Head: Normocephalic and atraumatic.  Cardiovascular:     Pulses: Normal pulses.  Pulmonary:     Effort: Pulmonary effort is normal.  Musculoskeletal:        General: Tenderness and signs of injury present. No swelling. Normal range of motion.     Comments: Wound primarily to the left distal index finger nail as pictured. Superficial wounds to the third and forth fingers.  Skin:    General: Skin is warm and dry.     Capillary Refill: Capillary refill takes less than 2 seconds.     Findings: No erythema or rash.  Neurological:     Mental Status: He is alert and oriented to person, place, and time.     Sensory: No sensory deficit.     Motor: No weakness.  Psychiatric:        Behavior: Behavior normal.     ED Results / Procedures / Treatments   Labs (all labs ordered are listed, but only abnormal results  are displayed) Labs Reviewed - No data to display  EKG None  Radiology DG Hand Complete Left  Result Date: 12/26/2020 CLINICAL DATA:  Index finger and middle finger injury. EXAM: LEFT HAND - COMPLETE 3+ VIEW COMPARISON:  None. FINDINGS: There is no evidence of fracture or dislocation. There is no evidence of arthropathy or other focal bone abnormality. Soft tissue swelling of the second and third fingers. IMPRESSION: Soft tissue swelling of the second and third fingers with no underlying osseous abnormality. Electronically Signed   By: Allegra Lai M.D.   On: 12/26/2020 13:57    Procedures Procedures   Medications Ordered in ED Medications  bacitracin ointment (has no administration in time range)    ED Course  I have reviewed the triage vital signs and the nursing notes.  Pertinent  labs & imaging results that were available during my care of the patient were reviewed by me and considered in my medical decision making (see chart for details).  Clinical Course as of 12/26/20 1547  Mon Dec 26, 2020  6251 35 year old male with left finger injury as above. XR negative for bony injury and FB. Bleeding controlled. Sensation and motor intact.  Discussed with Dr. Izora Ribas, on call for hand, recommends clean and dress, will follow up in clinic, advises against removal of the nail for nailbed repair at this time. Discussed with patient who verbalizes understanding. Wound was soaked prior to cleaning and dressing for discharge.  [LM]    Clinical Course User Index [LM] Alden Hipp   MDM Rules/Calculators/A&P                           Final Clinical Impression(s) / ED Diagnoses Final diagnoses:  Injury of nail bed of finger of left hand, initial encounter    Rx / DC Orders ED Discharge Orders     None        Jeannie Fend, PA-C 12/26/20 1547    Alvira Monday, MD 12/28/20 2221

## 2022-07-27 IMAGING — DX DG HAND COMPLETE 3+V*L*
3 series · 3 of 3 positions shown · non-contrast
Comparison: None.

CLINICAL DATA: Index finger and middle finger injury.

EXAM:
LEFT HAND - COMPLETE 3+ VIEW

[hand pa]
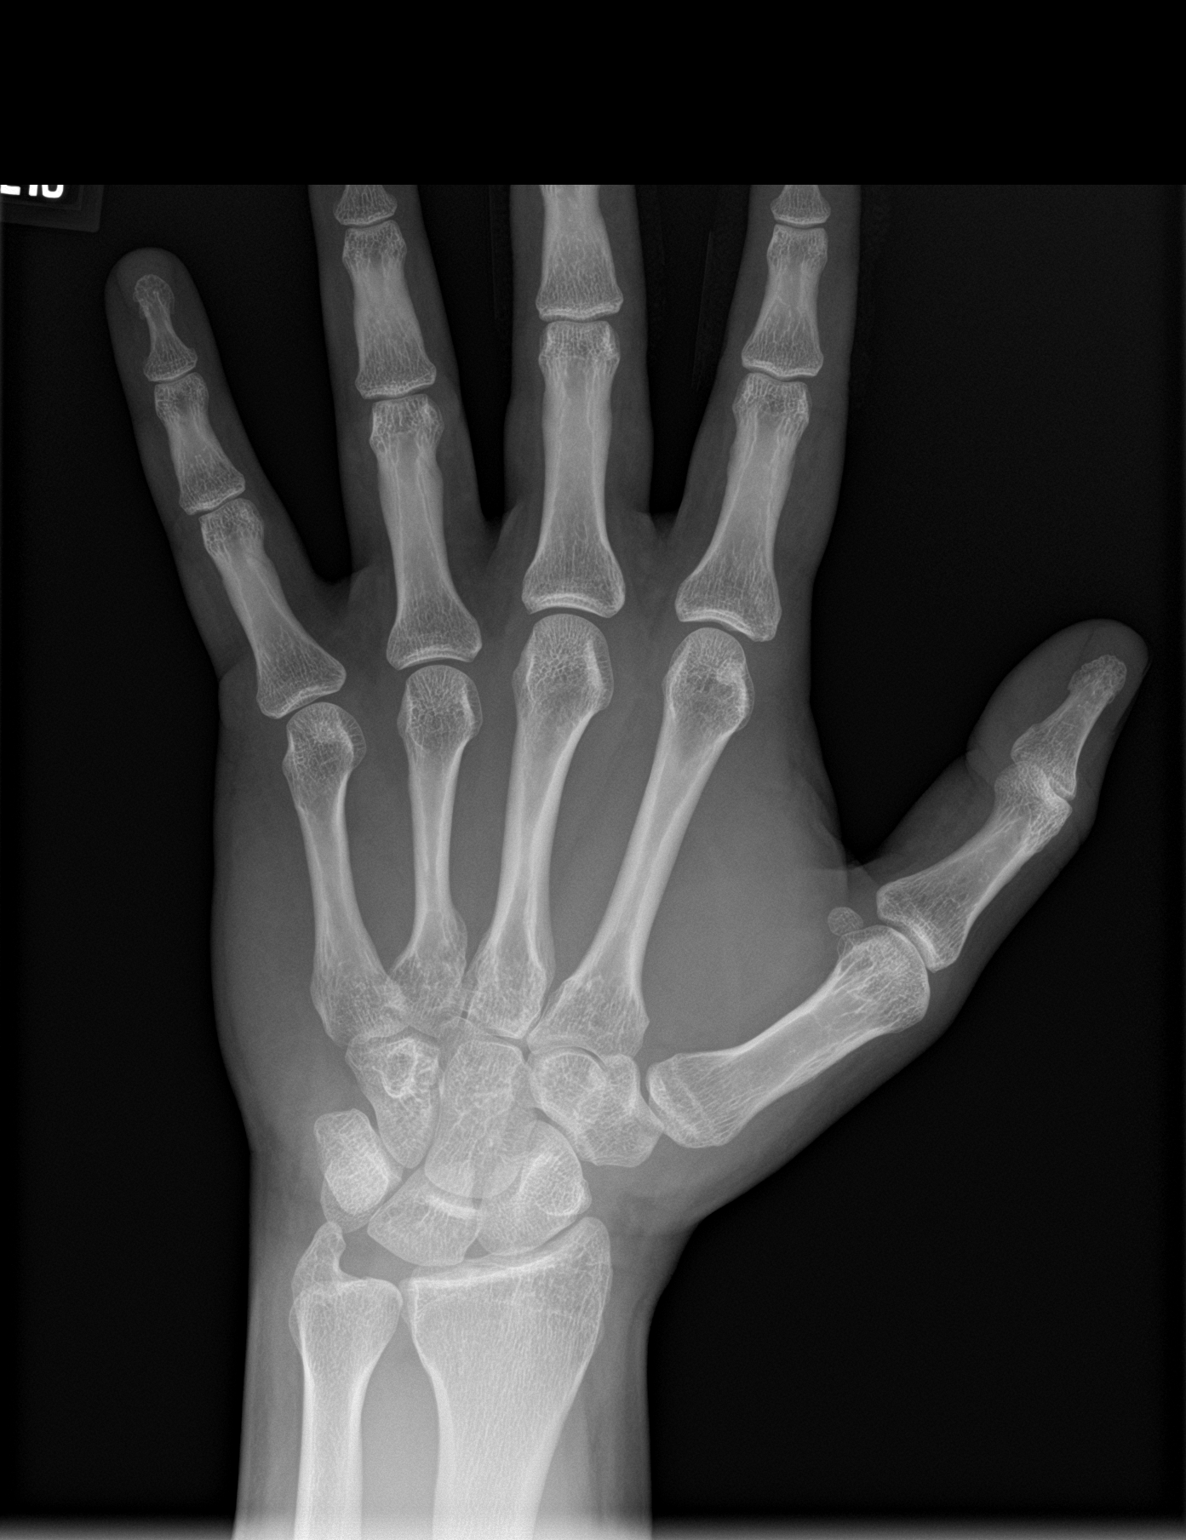

[hand obl]
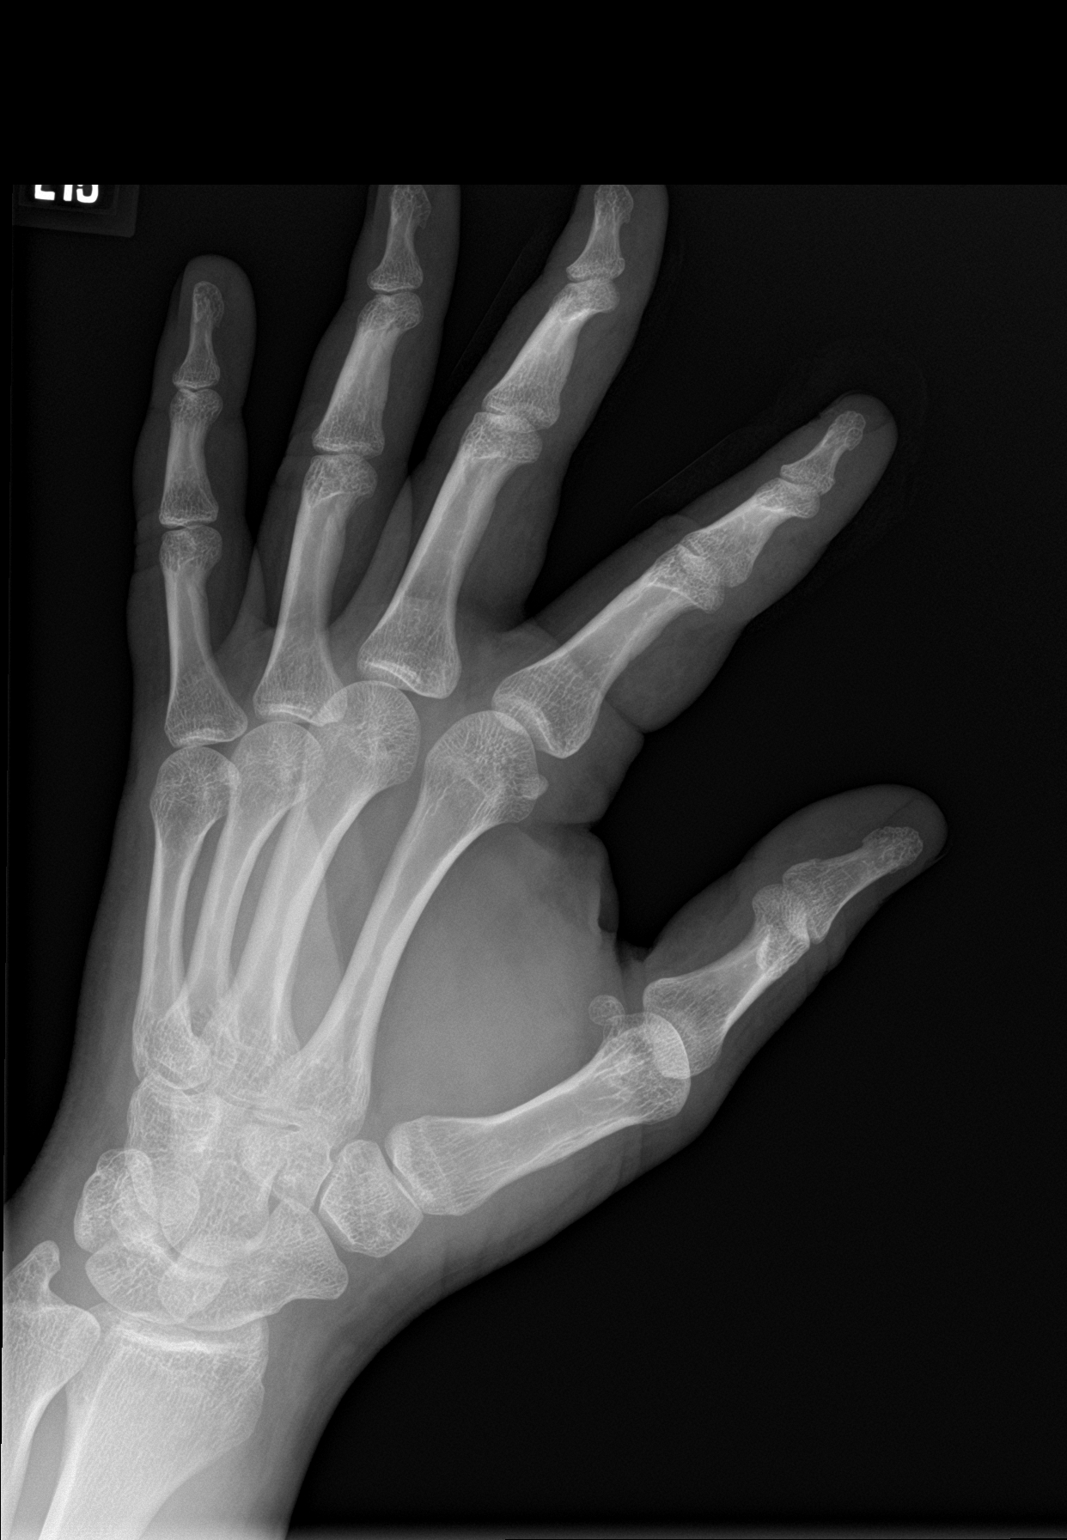

[hand lat]
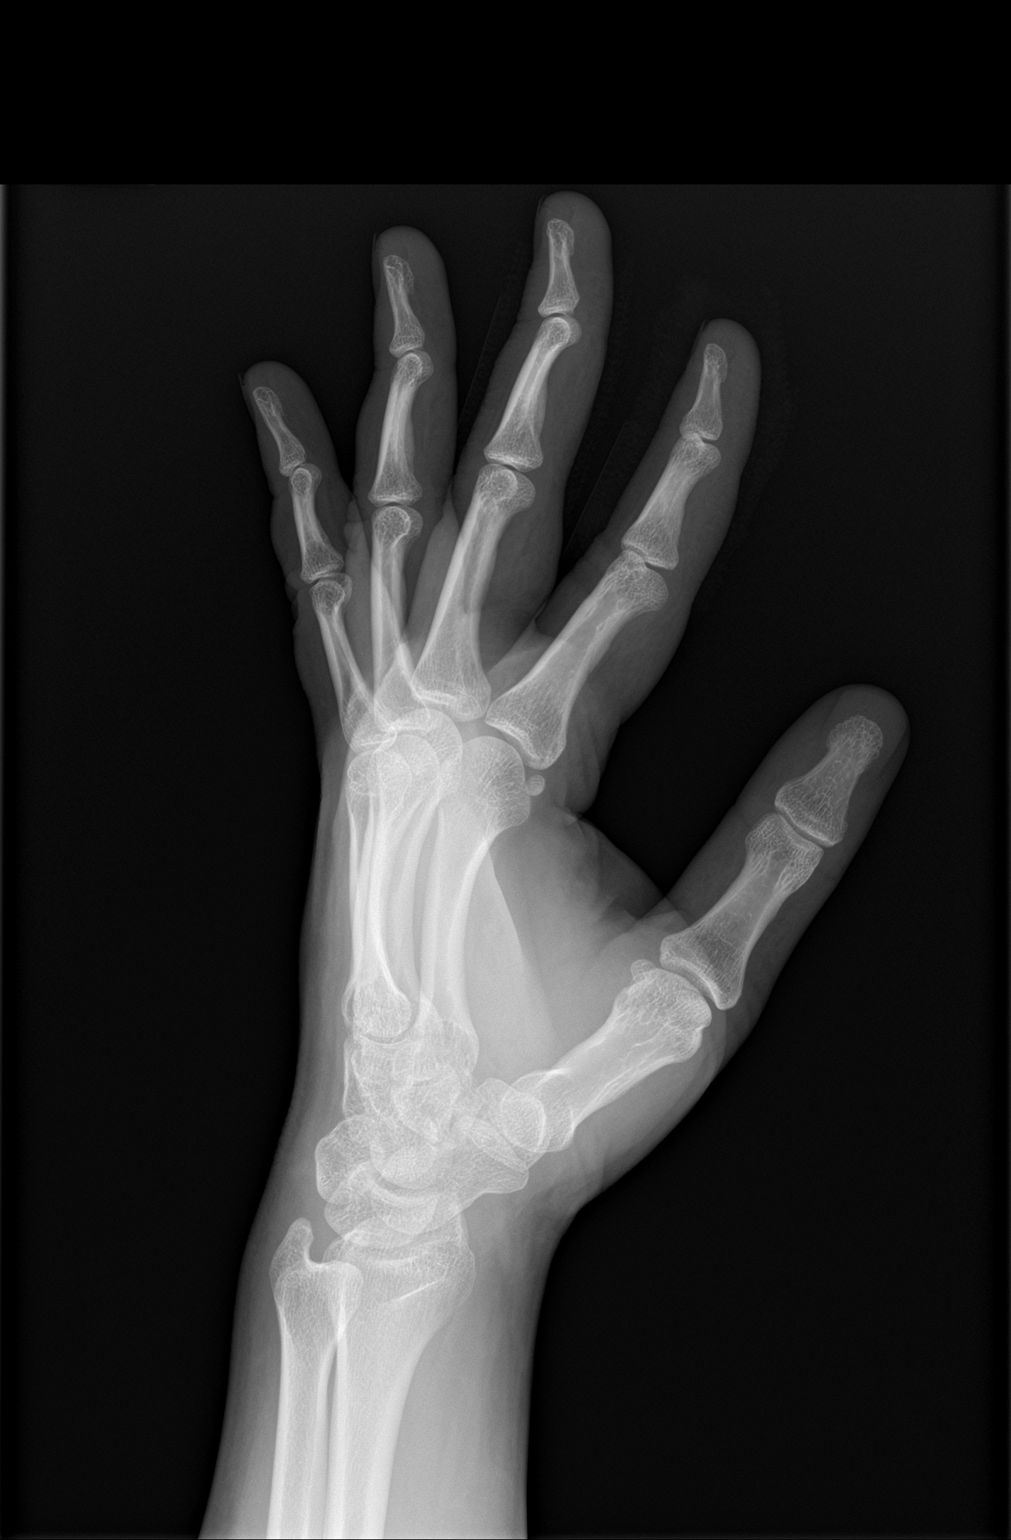

[3 of 3 positions shown; findings below may reference images not displayed]

FINDINGS: There is no evidence of fracture or dislocation. There is no
evidence of arthropathy or other focal bone abnormality. Soft tissue
swelling of the second and third fingers.
IMPRESSION: Soft tissue swelling of the second and third fingers with no
underlying osseous abnormality.
